# Patient Record
Sex: Male | Born: 2000 | Race: White | Hispanic: Yes | Marital: Single | Smoking: Never smoker
Health system: Southern US, Community
[De-identification: ages and names within clinical notes are randomized; demographics above are authoritative.]

## PROBLEM LIST (undated history)

## (undated) DIAGNOSIS — J45909 Unspecified asthma, uncomplicated: Secondary | ICD-10-CM

## (undated) DIAGNOSIS — F79 Unspecified intellectual disabilities: Secondary | ICD-10-CM

## (undated) DIAGNOSIS — F840 Autistic disorder: Secondary | ICD-10-CM

## (undated) DIAGNOSIS — K5900 Constipation, unspecified: Secondary | ICD-10-CM

## (undated) DIAGNOSIS — K56609 Unspecified intestinal obstruction, unspecified as to partial versus complete obstruction: Secondary | ICD-10-CM

## (undated) DIAGNOSIS — Q793 Gastroschisis: Secondary | ICD-10-CM

## (undated) HISTORY — DX: Constipation, unspecified: K59.00

## (undated) HISTORY — PX: GASTROSTOMY: SHX151

## (undated) HISTORY — DX: Unspecified intestinal obstruction, unspecified as to partial versus complete obstruction: K56.609

## (undated) HISTORY — PX: ORCHIOPEXY: SHX479

## (undated) HISTORY — PX: NISSEN FUNDOPLICATION: SHX2091

---
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
## 2018-01-06 ENCOUNTER — Other Ambulatory Visit: Payer: Self-pay | Admitting: Pediatrics

## 2018-01-06 DIAGNOSIS — J310 Chronic rhinitis: Secondary | ICD-10-CM

## 2018-01-07 IMAGING — CR DG ABDOMEN 2V
3 series · 3 of 3 positions shown · non-contrast
Comparison: May 29, 2016

CLINICAL DATA: Fever and diarrhea for 2 days.

EXAM:
ABDOMEN - 2 VIEW

[abdomen supine (1 of 2)]
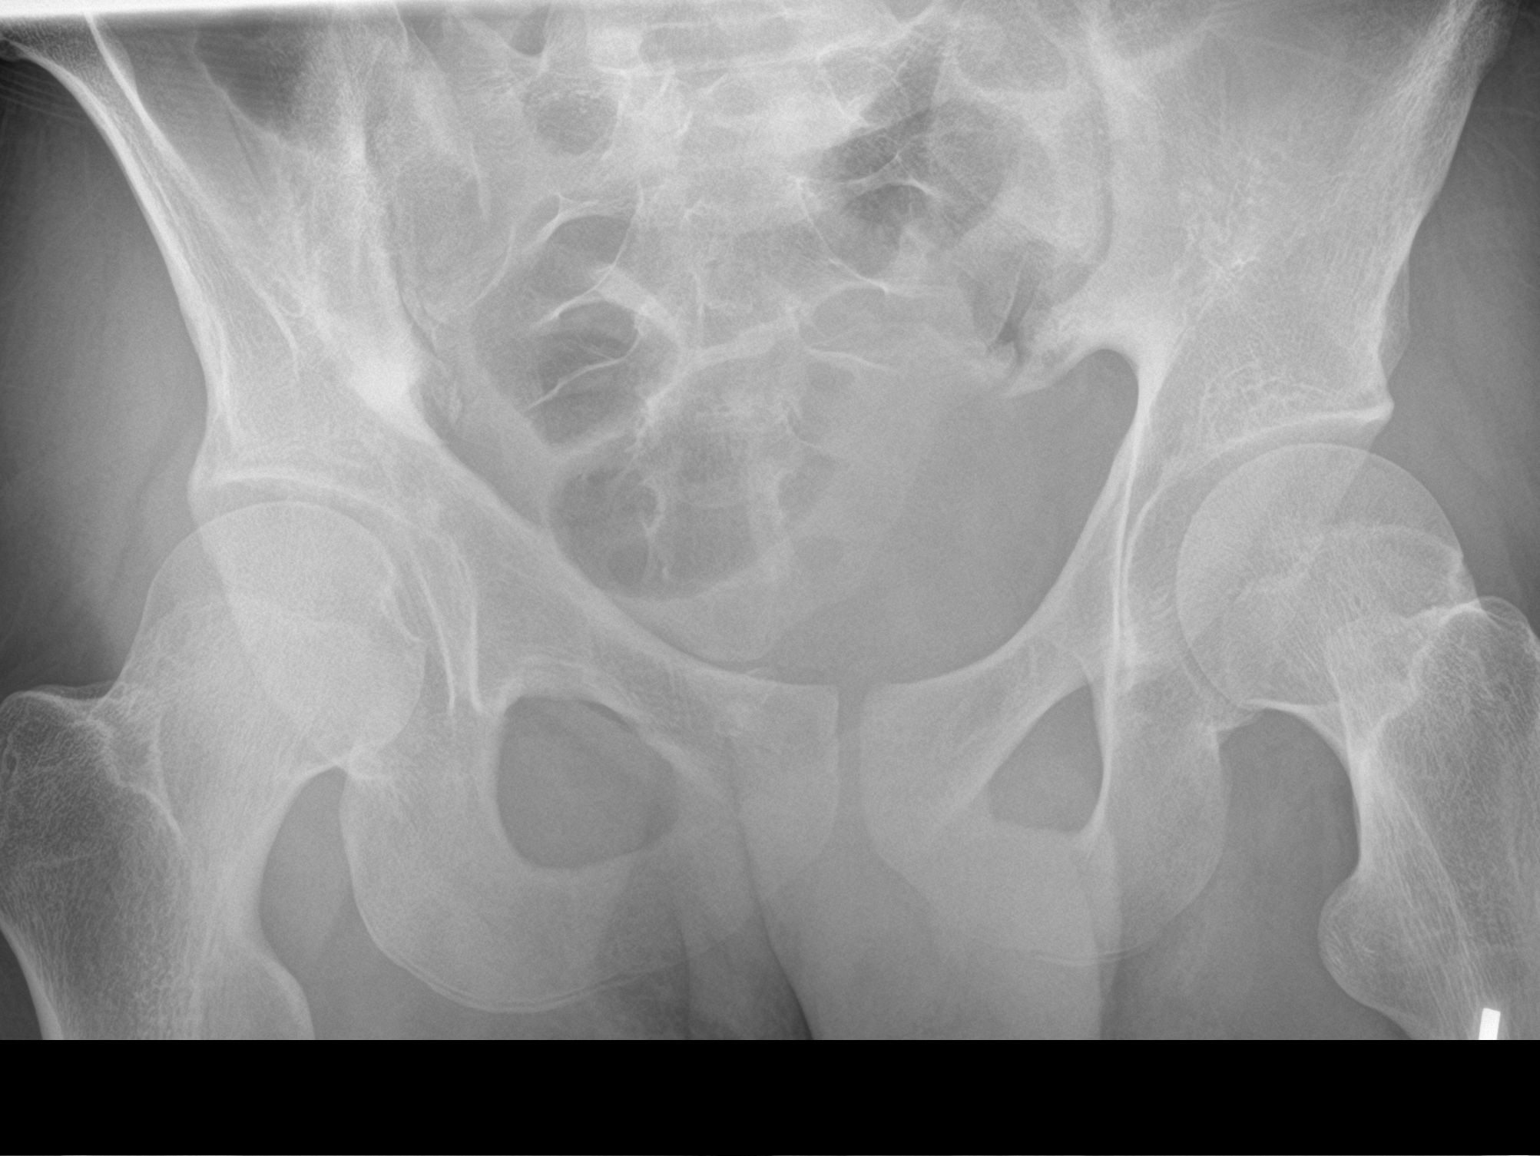

[abdomen supine (2 of 2)]
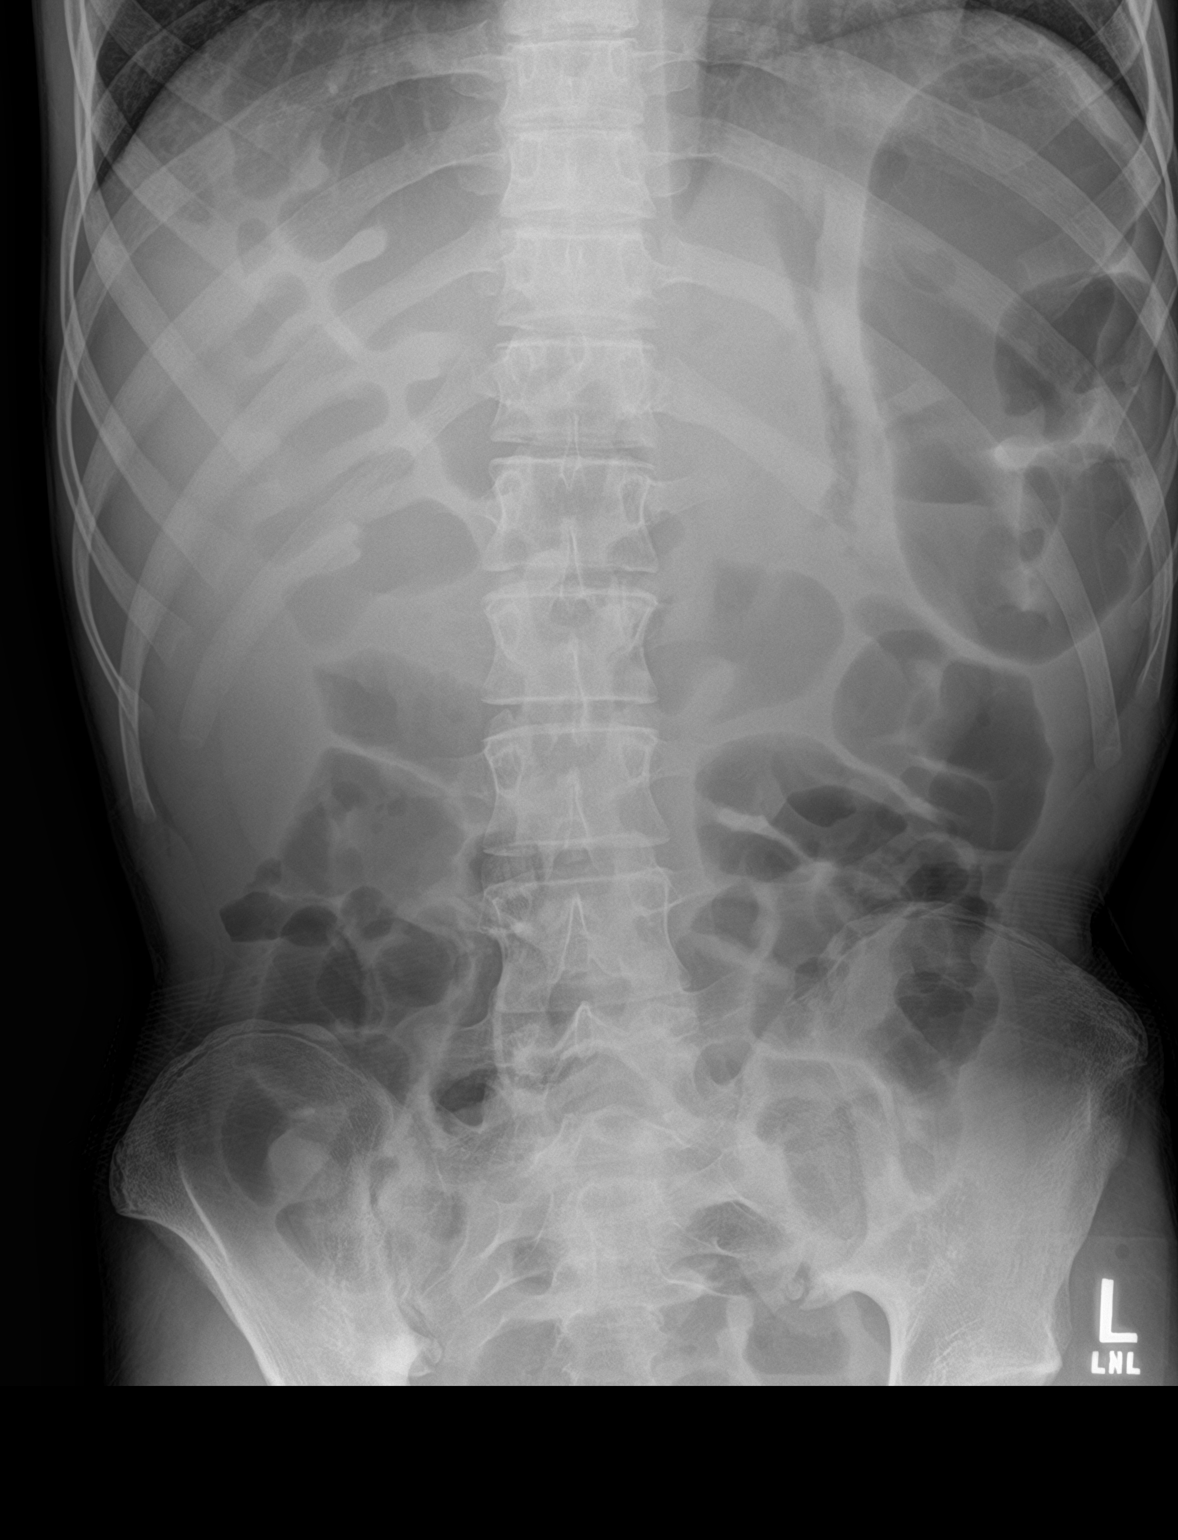

[abdomen erect]
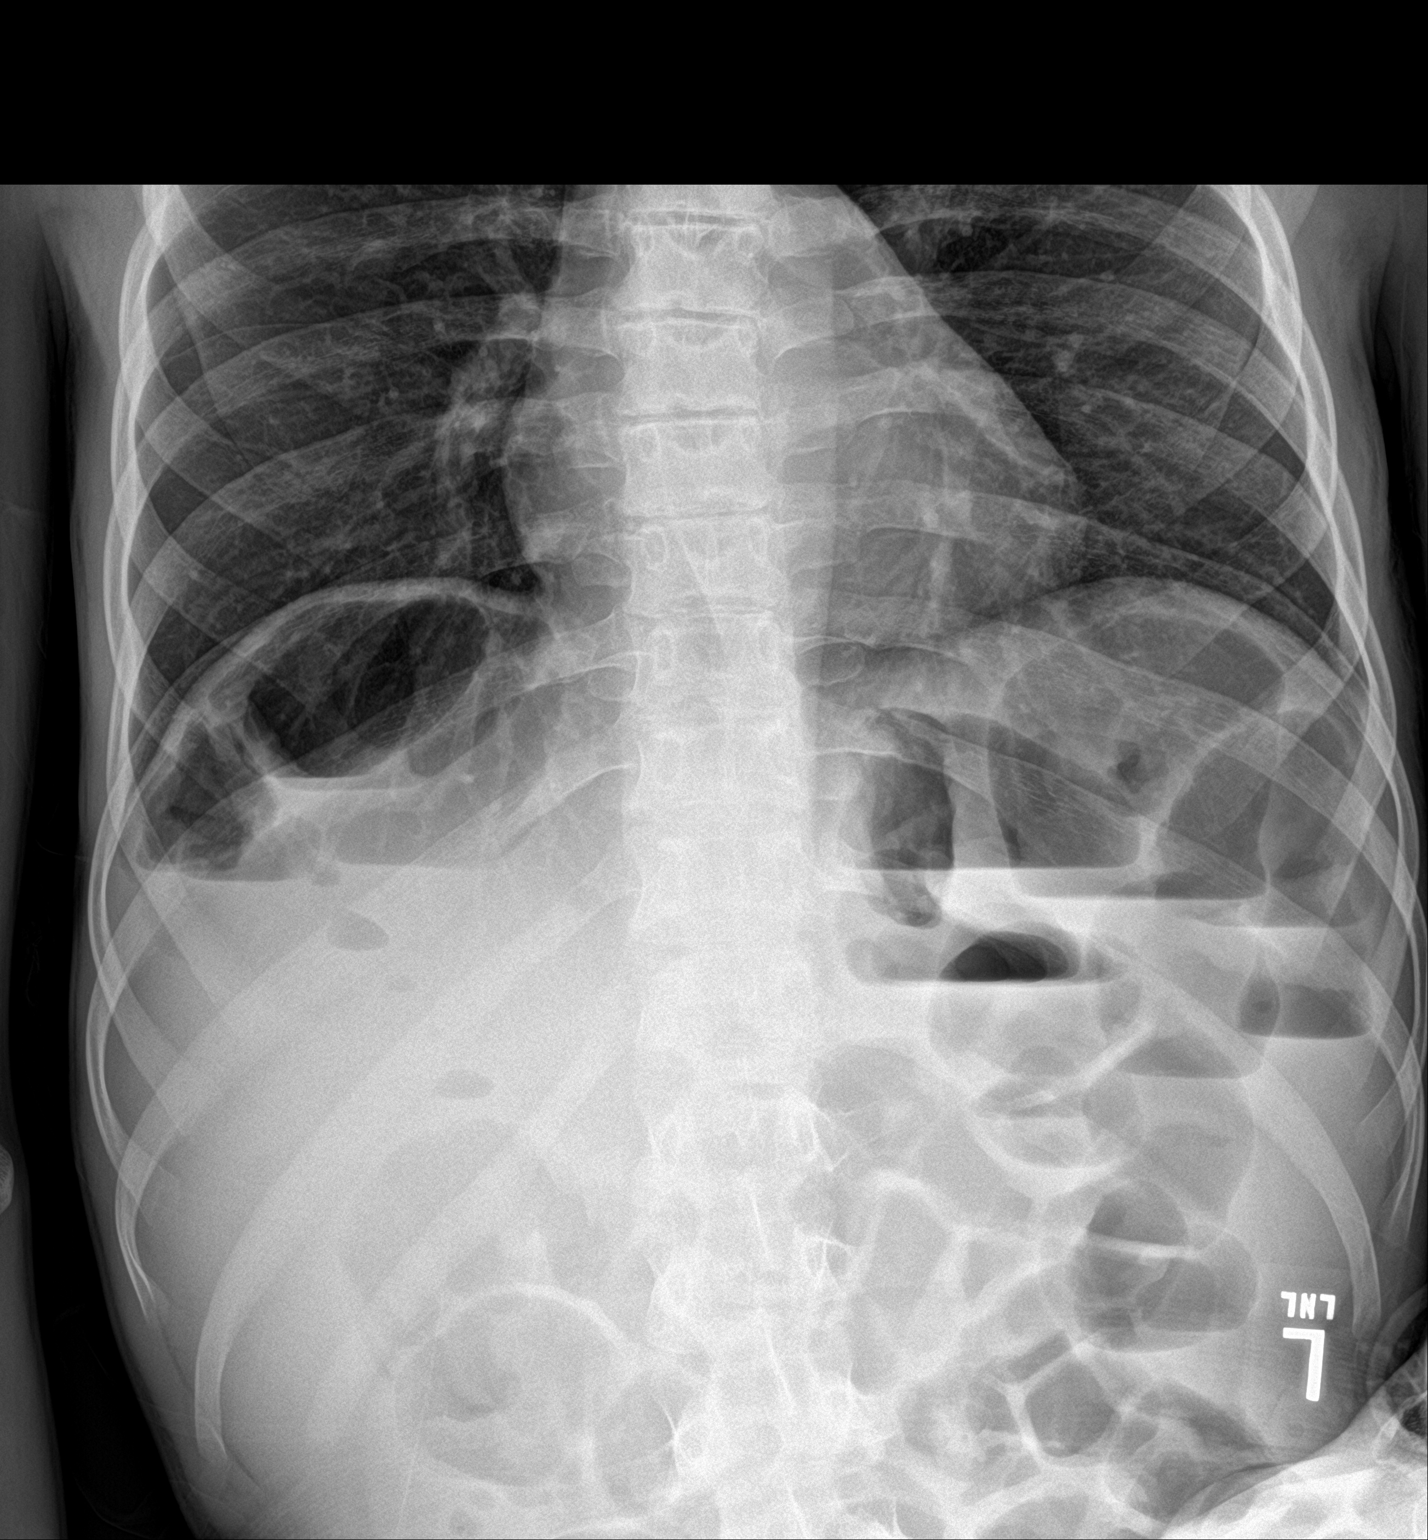

[3 of 3 positions shown; findings below may reference images not displayed]

FINDINGS: There is a mildly prominent loop of bowel in the left upper
quadrant, likely colon. The remainder of the bowel is normal in
caliber on supine imaging. Air-fluid levels are seen within loops of
colon on the upright view. There is colon interposed between the
liver and diaphragm. No free air.
IMPRESSION: 1. Air-fluid levels within the colon on upright imaging consistent
with diarrhea. No free air identified.
2. There is a mildly prominent loop of colon in the left upper
quadrant which is nonspecific. No small bowel obstruction.

## 2018-02-02 ENCOUNTER — Other Ambulatory Visit: Payer: Self-pay | Admitting: Pediatrics

## 2018-02-02 DIAGNOSIS — J310 Chronic rhinitis: Secondary | ICD-10-CM

## 2018-03-28 ENCOUNTER — Other Ambulatory Visit: Payer: Self-pay | Admitting: Pediatrics

## 2018-03-28 DIAGNOSIS — J310 Chronic rhinitis: Secondary | ICD-10-CM

## 2018-04-23 ENCOUNTER — Other Ambulatory Visit: Payer: Self-pay | Admitting: Pediatrics

## 2018-04-23 DIAGNOSIS — J310 Chronic rhinitis: Secondary | ICD-10-CM

## 2018-04-23 NOTE — Telephone Encounter (Signed)
Routed to correct pod, blue.  

## 2018-05-31 ENCOUNTER — Other Ambulatory Visit: Payer: Self-pay | Admitting: Pediatrics

## 2018-05-31 DIAGNOSIS — J310 Chronic rhinitis: Secondary | ICD-10-CM

## 2018-07-02 ENCOUNTER — Other Ambulatory Visit: Payer: Self-pay | Admitting: Pediatrics

## 2018-07-02 DIAGNOSIS — J310 Chronic rhinitis: Secondary | ICD-10-CM

## 2018-07-31 ENCOUNTER — Other Ambulatory Visit: Payer: Self-pay | Admitting: Pediatrics

## 2018-07-31 DIAGNOSIS — J310 Chronic rhinitis: Secondary | ICD-10-CM

## 2018-08-02 ENCOUNTER — Ambulatory Visit: Payer: Medicaid Other | Admitting: Pediatrics

## 2018-08-03 ENCOUNTER — Other Ambulatory Visit: Payer: Self-pay | Admitting: Pediatrics

## 2018-08-03 DIAGNOSIS — J4540 Moderate persistent asthma, uncomplicated: Secondary | ICD-10-CM

## 2018-08-23 ENCOUNTER — Other Ambulatory Visit: Payer: Self-pay | Admitting: Pediatrics

## 2018-08-23 DIAGNOSIS — J310 Chronic rhinitis: Secondary | ICD-10-CM

## 2018-10-05 ENCOUNTER — Other Ambulatory Visit: Payer: Self-pay | Admitting: Pediatrics

## 2018-10-05 DIAGNOSIS — J310 Chronic rhinitis: Secondary | ICD-10-CM

## 2018-10-05 NOTE — Telephone Encounter (Signed)
Will forward to correct pod, blue Rx.

## 2018-10-20 ENCOUNTER — Ambulatory Visit: Payer: Medicaid Other

## 2018-11-14 ENCOUNTER — Telehealth: Payer: Self-pay

## 2018-11-14 NOTE — Telephone Encounter (Signed)
Mother would like a call back with concerns about son returning to school. They are having students coming back to school this months and since pts is compromised, she would like to talk to you about her concerns.

## 2018-11-15 NOTE — Telephone Encounter (Signed)
I spoke with mom about her concerns.

## 2018-12-24 ENCOUNTER — Telehealth: Payer: Self-pay

## 2018-12-24 NOTE — Telephone Encounter (Signed)
Pre-screening for onsite visit  1. Who is bringing the patient to the visit? Dad only, mom said that the patient did start school today.  Informed only one adult can bring patient to the visit to limit possible exposure to COVID19 and facemasks must be worn while in the building by the patient (ages 17 and older) and adult.  2. Has the person bringing the patient or the patient been around anyone with suspected or confirmed COVID-19 in the last 14 days?    3. Has the person bringing the patient or the patient been around anyone who has been tested for COVID-19 in the last 14 days?   4. Has the person bringing the patient or the patient had any of these symptoms in the last 14 days?  Fever (temp 100 F or higher) Breathing problems Cough Sore throat Body aches Chills Vomiting Diarrhea   If all answers are negative, advise patient to call our office prior to your appointment if you or the patient develop any of the symptoms listed above.   If any answers are yes, cancel in-office visit and schedule the patient for a same day telehealth visit with a provider to discuss the next steps.

## 2018-12-25 ENCOUNTER — Other Ambulatory Visit (HOSPITAL_COMMUNITY)
Admission: RE | Admit: 2018-12-25 | Discharge: 2018-12-25 | Disposition: A | Discharge status: Home / Self Care | Payer: Medicaid Other | Source: Ambulatory Visit | Attending: Pediatrics | Admitting: Pediatrics

## 2018-12-25 ENCOUNTER — Encounter: Payer: Self-pay | Admitting: Pediatrics

## 2018-12-25 ENCOUNTER — Ambulatory Visit (INDEPENDENT_AMBULATORY_CARE_PROVIDER_SITE_OTHER): Payer: Medicaid Other | Admitting: Pediatrics

## 2018-12-25 ENCOUNTER — Other Ambulatory Visit: Payer: Self-pay

## 2018-12-25 ENCOUNTER — Encounter: Payer: Self-pay | Admitting: *Deleted

## 2018-12-25 VITALS — BP 111/73 | HR 93 | Ht 69.29 in | Wt 117.0 lb

## 2018-12-25 DIAGNOSIS — R350 Frequency of micturition: Secondary | ICD-10-CM

## 2018-12-25 DIAGNOSIS — R636 Underweight: Secondary | ICD-10-CM

## 2018-12-25 DIAGNOSIS — Z68.41 Body mass index (BMI) pediatric, less than 5th percentile for age: Secondary | ICD-10-CM | POA: Diagnosis not present

## 2018-12-25 DIAGNOSIS — Z113 Encounter for screening for infections with a predominantly sexual mode of transmission: Secondary | ICD-10-CM | POA: Diagnosis not present

## 2018-12-25 DIAGNOSIS — Z0001 Encounter for general adult medical examination with abnormal findings: Secondary | ICD-10-CM

## 2018-12-25 DIAGNOSIS — F919 Conduct disorder, unspecified: Secondary | ICD-10-CM

## 2018-12-25 DIAGNOSIS — F840 Autistic disorder: Secondary | ICD-10-CM

## 2018-12-25 LAB — POCT URINALYSIS DIPSTICK
Bilirubin, UA: NEGATIVE
Blood, UA: NEGATIVE
Glucose, UA: NEGATIVE
Leukocytes, UA: NEGATIVE
Nitrite, UA: NEGATIVE
Protein, UA: NEGATIVE
Spec Grav, UA: 1.01 (ref 1.010–1.025)
Urobilinogen, UA: NEGATIVE E.U./dL — AB
pH, UA: 8 (ref 5.0–8.0)

## 2018-12-25 LAB — POCT RAPID HIV: Rapid HIV, POC: NEGATIVE

## 2018-12-25 NOTE — Progress Notes (Signed)
Adolescent Well Care Visit [name] is a 18 y.o. male who is here for well care.    PCP:  Carmie End, MD   History was provided by the father. Patient is non-verbal.  Current Issues: Current concerns include knuckles are red and swollen - he is picking at his nails - problem for years but worse recently over the past few months.   He rubs his hands repeatedly.  He is peeing more frequently than usual during the day for the past several months while he has been home.  This isn't every day but some days.  He goes to the bathroom quickly after eating a meal.   No concerns about constipation or diarrhea.  Asthma - Previously prescribed pulmicort 0.5 mg BID and albuterol nebs prn.  Pulmicort recently restarted giving twice daily about 2 weeks ago in preparation for return to school.  Last used Albuterol several months ago.   Allergies are better with cetirizine.  Taking daily in the morning.   Autism - previously prescribed divalproex and risperidone.  Previously seen by Dr. Quentin Cornwall and then transferred care to [facility] psychiatry at age 19.  He now sees an NP there every 3 months - father is unhappy with the care there - they have not made changes to his medication regimen - in spite of parents reporting worsening behavior over the past few months.  He  just started school yesterday in person 2 days per week - previously all online and he was not able to pay attention and engage with the online classes.    Underweight - still taking Ensure twice daily.  Picky eater and eats slowly.  No constipation.  One episode of diarrhea yesterday.   Nutrition: Nutrition/Eating Behaviors: fruits in smoothies, doesn't like any meat, them try to hide it but he picks it out, Ensure twice daily Adequate calcium in diet?: yes Supplements/ Vitamins: MVI, VItamin C  Sleep:  Sleep: sometimes difficulty falling asleep - has Rx from psychiatry that doesn't seem to help any more.  Social  Screening: Lives with:  Parents and younger brother Stressors of note: yes - COVID pandemic  Education: School Name: [employer]  School Grade: EC classroom until age 59. School performance: he did not do well with online classes, father is hopeful that in-person school will go better for him.  He is in a small self-contained classroom with about 5 other students. School Behavior: more repetitive self-stim behaviors recently.   Patient has a dental home: yes  Physical Exam:  Vitals:   12/25/18 0946  BP: 111/73  Pulse: 93  Weight: 117 lb (53.1 kg)  Height: 5' 9.29" (1.76 m)   BP 111/73 (BP Location: Left Arm, Patient Position: Sitting, Cuff Size: Normal)   Pulse 93   Ht 5' 9.29" (1.76 m)   Wt 117 lb (53.1 kg)   BMI 17.13 kg/m  Body mass index: body mass index is 17.13 kg/m.    Hearing Screening   Method: Otoacoustic emissions   125Hz  250Hz  500Hz  1000Hz  2000Hz  3000Hz  4000Hz  6000Hz  8000Hz   Right ear:           Left ear:           Comments: OAE-left ear pass,right ear pass   General Appearance:   awake, poor eye contact, cooperative with exam, nonverbal  HENT: Dolichocephaly present, conjunctiva clear, PERRL  Mouth:   Normal appearing teeth, no obvious discoloration or dental caries  Neck:   Supple; thyroid: no enlargement,  symmetric, no tenderness/mass/nodules  Chest Normal male  Lungs:   Clear to auscultation bilaterally, normal work of breathing  Heart:   Regular rate and rhythm, S1 and S2 normal, no murmurs;   Abdomen:   Soft, non-tender, no mass, or organomegaly  GU normal male genitals, no testicular masses or hernia, Tanner stage V  Musculoskeletal:   Tone and strength strong and symmetrical, all extremities               Lymphatic:   No cervical adenopathy  Skin/Hair/Nails:   Skin warm, dry and intact, no rashes, no bruises or petechiae  Neurologic:   Strength, gait, and coordination normal and age-appropriate   Assessment and Plan:   Routine  screening for STI (sexually transmitted infection) At risk age group - POC Rapid HIV - GC/Chlamydia [facility] Lab - for urine and other sample types  Urinary frequency Dilute specific gravity.  No blood, protein, or signs of infection.  Small ketones - patient fasting this AM.  If urinary frequency presists, will plan to restrict water intake and repeat U/A to ensure appropriate concentration of urine.   - POC Urinalysis (dx code Z13.89)  Autism spectrum disorder with accompanying language impairment and intellectual disability, requiring substantial support Father requests referral to new provider for medications management given worsening behaviors on current medications.   - Referral to Pediatric Psychiatry  Underweight - BMI is not appropriate for age - he remains underweight but has gained weight and his BMI has increased slightly to 17.1.  Continue Ensure BID and reviewed high-calorie foods.    Hearing screening result:normal OAE Vision screening result: unable to complete vision screening - sees eye doctor annually for his glasses   Return for 18 year old PE with Dr. Luna Fuse in 1 year.Clifton Custard, MD

## 2018-12-26 LAB — URINE CYTOLOGY ANCILLARY ONLY
Chlamydia: NEGATIVE
Comment: NEGATIVE
Comment: NORMAL
Neisseria Gonorrhea: NEGATIVE

## 2019-01-17 ENCOUNTER — Ambulatory Visit (INDEPENDENT_AMBULATORY_CARE_PROVIDER_SITE_OTHER): Payer: Medicaid Other | Admitting: Pediatrics

## 2019-01-17 ENCOUNTER — Other Ambulatory Visit: Payer: Self-pay

## 2019-01-17 ENCOUNTER — Encounter: Payer: Self-pay | Admitting: Pediatrics

## 2019-01-17 VITALS — Temp 98.2°F

## 2019-01-17 DIAGNOSIS — Z711 Person with feared health complaint in whom no diagnosis is made: Secondary | ICD-10-CM

## 2019-01-17 NOTE — Progress Notes (Signed)
Virtual Visit via Video Note  I connected with [name] 's mother  on 01/17/19 at  5:00 PM EST by a video enabled telemedicine application and verified that I am speaking with the correct person using two identifiers.   Location of patient/parent: patients home   I discussed the limitations of evaluation and management by telemedicine and the availability of in person appointments.  I discussed that the purpose of this telehealth visit is to provide medical care while limiting exposure to the novel coronavirus.  The mother expressed understanding and agreed to proceed.  Reason for visit:  Follow-up for "fever"  History of Present Illness: 17yo with autism and asthma/allergies who calls with mom for a "fever". Mom states [name] was bundled and sent to school when they did a general "temperature check" via forehead probe and was 102. Repeated it right away and was 104. Dad immediately picked him up and rechecked which was 98. Mom has been taking the temperature every two hours since 2 days ago and have had no fever. He has no symptoms--no cough, rhinorrhea, urine changes, pain (including myalgias). She has monitored him now for nearly 48 hours with no symptoms. No one in the house with symptoms. No COVID exposures.   Observations/Objective: appears well, repeating what mom states. Currently afebrile  Assessment and Plan: 18yo M with autism and asthma with an apparent fever at school. It seems to me this may be an error given that no antipyretics were given and now the temperature has been normal for nearly 48 hours. Provided mother with emailed note to return to school unless fever were to recurr.  Follow Up Instructions: see above   I discussed the assessment and treatment plan with the patient and/or parent/guardian. They were provided an opportunity to ask questions and all were answered. They agreed with the plan and demonstrated an understanding of the instructions.   They were  advised to call back or seek an in-person evaluation in the emergency room if the symptoms worsen or if the condition fails to improve as anticipated.  I spent 10 minutes on this telehealth visit inclusive of face-to-face video and care coordination time I was located at [facility] during this encounter.  Alma Friendly, MD

## 2019-04-03 ENCOUNTER — Telehealth: Payer: Self-pay | Admitting: Licensed Clinical Social Worker

## 2019-04-03 NOTE — Telephone Encounter (Signed)
Called w/ assistance of [employer]. LVM for mom requesting call back.

## 2019-07-11 ENCOUNTER — Telehealth: Payer: Self-pay | Admitting: Pediatrics

## 2019-07-11 NOTE — Telephone Encounter (Signed)
"  Disability parking placard" placed in PCP's folder to be completed and sign. 

## 2019-07-11 NOTE — Telephone Encounter (Signed)
Please call as soon form is ready to pick up [phone]

## 2019-07-15 NOTE — Telephone Encounter (Signed)
Form remains in Dr. Ettefagh's folder. 

## 2019-07-16 NOTE — Telephone Encounter (Signed)
Form done. Original placed at front desk for pick up. Copy made for med record to be scan  

## 2019-10-01 ENCOUNTER — Other Ambulatory Visit: Payer: Self-pay | Admitting: Pediatrics

## 2019-10-01 DIAGNOSIS — J4540 Moderate persistent asthma, uncomplicated: Secondary | ICD-10-CM

## 2019-10-18 ENCOUNTER — Other Ambulatory Visit: Payer: Self-pay | Admitting: Critical Care Medicine

## 2019-10-18 ENCOUNTER — Other Ambulatory Visit: Payer: Self-pay

## 2019-10-18 DIAGNOSIS — Z20822 Contact with and (suspected) exposure to covid-19: Secondary | ICD-10-CM

## 2019-10-21 LAB — NOVEL CORONAVIRUS, NAA: SARS-CoV-2, NAA: NOT DETECTED

## 2019-10-26 ENCOUNTER — Other Ambulatory Visit: Payer: Self-pay | Admitting: Pediatrics

## 2019-10-26 DIAGNOSIS — J310 Chronic rhinitis: Secondary | ICD-10-CM

## 2019-10-30 ENCOUNTER — Other Ambulatory Visit: Payer: Medicaid Other

## 2019-10-30 DIAGNOSIS — Z20822 Contact with and (suspected) exposure to covid-19: Secondary | ICD-10-CM

## 2019-11-01 LAB — SARS-COV-2, NAA 2 DAY TAT

## 2019-11-01 LAB — NOVEL CORONAVIRUS, NAA: SARS-CoV-2, NAA: NOT DETECTED

## 2020-03-16 ENCOUNTER — Telehealth: Payer: Self-pay

## 2020-03-16 ENCOUNTER — Other Ambulatory Visit: Payer: Self-pay | Admitting: Pediatrics

## 2020-03-16 DIAGNOSIS — J4540 Moderate persistent asthma, uncomplicated: Secondary | ICD-10-CM

## 2020-03-16 MED ORDER — FLOVENT HFA 110 MCG/ACT IN AERO: 2.0000 | INHALATION_SPRAY | Freq: Two times a day (BID) | RESPIRATORY_TRACT | 12 refills | Status: DC

## 2020-03-16 MED ORDER — BUDESONIDE 0.5 MG/2ML IN SUSP: 0.5000 mg | Freq: Two times a day (BID) | RESPIRATORY_TRACT | 12 refills | Status: AC

## 2020-03-16 NOTE — Telephone Encounter (Signed)
Spoke with [name]'s mother, [name], who says that [name] cannot use an inhaler,he needs a Nebulizer.She also needs a list of Providers to arrange for his future care.

## 2020-03-16 NOTE — Progress Notes (Signed)
Mother reports that pulmicort is no longer covered by his insurance.  New Rx for flovent 110 mcg inhaler was sent to the pharmacy on file.

## 2020-03-16 NOTE — Telephone Encounter (Signed)
Please call his mother and let her know that I will send a prescription for Flovent to the pharmacy which is an inhaler that is equivalent to the pulmicort nebulizers.  Please also let her know that Menelik will need to transition to an adult medical provider since our office is not contracted with Medicare.  Let mom know that I can provide a list of adult medical providers for Jatin if that would be helpful.

## 2020-03-16 NOTE — Telephone Encounter (Signed)
I called and spoke with the pharmacy staff.  They were able to get the Rx covered by medicare with a patient co-pay.    I called and spoke with [place]'s mother about options for transition to adult medical care.  Recommend community [facility] center as an option for him.

## 2020-03-16 NOTE — Telephone Encounter (Signed)
Mom says that [employer] was changed to Medicare when he turned 19; they do not cover pulmicort solution for nebulizer.

## 2020-04-10 ENCOUNTER — Encounter: Payer: Self-pay | Admitting: Pediatrics

## 2020-05-21 ENCOUNTER — Encounter: Payer: Self-pay | Admitting: Developmental - Behavioral Pediatrics

## 2020-10-08 ENCOUNTER — Telehealth: Payer: Self-pay

## 2020-10-08 DIAGNOSIS — Z09 Encounter for follow-up examination after completed treatment for conditions other than malignant neoplasm: Secondary | ICD-10-CM

## 2020-10-08 NOTE — Telephone Encounter (Signed)
[employer] mailed adolescent transition dismissal letter to pt, marked dismissed  from practice in pt's chart due to age. Can be seen by [facility] until age 20 if needed.   Kodey Xue, BSW, QP Case Manager [facility] Office: [phone] Direct Number: [phone]  

## 2020-11-04 ENCOUNTER — Telehealth: Payer: Self-pay

## 2020-11-04 DIAGNOSIS — Z09 Encounter for follow-up examination after completed treatment for conditions other than malignant neoplasm: Secondary | ICD-10-CM

## 2020-11-04 NOTE — Telephone Encounter (Signed)
SWCM called mother regarding Adolescent Transition for pt. Mother stated that pt has been seen by adult PCP, and has a follow up in 6 months.    Kenn File, BSW, QP Case Manager [facility] Office: [phone] Direct Number: [phone]

## 2020-11-17 DIAGNOSIS — R269 Unspecified abnormalities of gait and mobility: Secondary | ICD-10-CM | POA: Diagnosis not present

## 2020-11-17 DIAGNOSIS — F79 Unspecified intellectual disabilities: Secondary | ICD-10-CM | POA: Diagnosis not present

## 2020-11-17 DIAGNOSIS — R636 Underweight: Secondary | ICD-10-CM | POA: Diagnosis not present

## 2020-12-16 DIAGNOSIS — F88 Other disorders of psychological development: Secondary | ICD-10-CM | POA: Diagnosis not present

## 2020-12-16 DIAGNOSIS — F79 Unspecified intellectual disabilities: Secondary | ICD-10-CM | POA: Diagnosis not present

## 2020-12-16 DIAGNOSIS — R636 Underweight: Secondary | ICD-10-CM | POA: Diagnosis not present

## 2020-12-16 DIAGNOSIS — F3481 Disruptive mood dysregulation disorder: Secondary | ICD-10-CM | POA: Diagnosis not present

## 2020-12-16 DIAGNOSIS — R269 Unspecified abnormalities of gait and mobility: Secondary | ICD-10-CM | POA: Diagnosis not present

## 2020-12-21 DIAGNOSIS — R636 Underweight: Secondary | ICD-10-CM | POA: Diagnosis not present

## 2020-12-21 DIAGNOSIS — F79 Unspecified intellectual disabilities: Secondary | ICD-10-CM | POA: Diagnosis not present

## 2020-12-21 DIAGNOSIS — R269 Unspecified abnormalities of gait and mobility: Secondary | ICD-10-CM | POA: Diagnosis not present

## 2021-01-14 DIAGNOSIS — R269 Unspecified abnormalities of gait and mobility: Secondary | ICD-10-CM | POA: Diagnosis not present

## 2021-01-14 DIAGNOSIS — F79 Unspecified intellectual disabilities: Secondary | ICD-10-CM | POA: Diagnosis not present

## 2021-01-14 DIAGNOSIS — R636 Underweight: Secondary | ICD-10-CM | POA: Diagnosis not present

## 2021-02-09 DIAGNOSIS — E786 Lipoprotein deficiency: Secondary | ICD-10-CM | POA: Diagnosis not present

## 2021-02-09 DIAGNOSIS — F840 Autistic disorder: Secondary | ICD-10-CM | POA: Diagnosis not present

## 2021-02-09 DIAGNOSIS — F39 Unspecified mood [affective] disorder: Secondary | ICD-10-CM | POA: Diagnosis not present

## 2021-02-09 DIAGNOSIS — J4530 Mild persistent asthma, uncomplicated: Secondary | ICD-10-CM | POA: Diagnosis not present

## 2021-02-09 DIAGNOSIS — Z20822 Contact with and (suspected) exposure to covid-19: Secondary | ICD-10-CM | POA: Diagnosis not present

## 2021-02-09 DIAGNOSIS — Z681 Body mass index (BMI) 19 or less, adult: Secondary | ICD-10-CM | POA: Diagnosis not present

## 2021-02-09 DIAGNOSIS — Z79899 Other long term (current) drug therapy: Secondary | ICD-10-CM | POA: Diagnosis not present

## 2021-02-10 DIAGNOSIS — F88 Other disorders of psychological development: Secondary | ICD-10-CM | POA: Diagnosis not present

## 2021-02-10 DIAGNOSIS — F840 Autistic disorder: Secondary | ICD-10-CM | POA: Diagnosis not present

## 2021-02-10 DIAGNOSIS — F3481 Disruptive mood dysregulation disorder: Secondary | ICD-10-CM | POA: Diagnosis not present

## 2021-02-15 DIAGNOSIS — R269 Unspecified abnormalities of gait and mobility: Secondary | ICD-10-CM | POA: Diagnosis not present

## 2021-02-15 DIAGNOSIS — F79 Unspecified intellectual disabilities: Secondary | ICD-10-CM | POA: Diagnosis not present

## 2021-02-15 DIAGNOSIS — R636 Underweight: Secondary | ICD-10-CM | POA: Diagnosis not present

## 2021-02-24 DIAGNOSIS — B351 Tinea unguium: Secondary | ICD-10-CM | POA: Diagnosis not present

## 2021-04-08 DIAGNOSIS — F79 Unspecified intellectual disabilities: Secondary | ICD-10-CM | POA: Diagnosis not present

## 2021-04-08 DIAGNOSIS — R636 Underweight: Secondary | ICD-10-CM | POA: Diagnosis not present

## 2021-04-08 DIAGNOSIS — R269 Unspecified abnormalities of gait and mobility: Secondary | ICD-10-CM | POA: Diagnosis not present

## 2021-05-05 DIAGNOSIS — F840 Autistic disorder: Secondary | ICD-10-CM | POA: Diagnosis not present

## 2021-05-05 DIAGNOSIS — F88 Other disorders of psychological development: Secondary | ICD-10-CM | POA: Diagnosis not present

## 2021-05-05 DIAGNOSIS — F3481 Disruptive mood dysregulation disorder: Secondary | ICD-10-CM | POA: Diagnosis not present

## 2021-05-18 DIAGNOSIS — F79 Unspecified intellectual disabilities: Secondary | ICD-10-CM | POA: Diagnosis not present

## 2021-05-18 DIAGNOSIS — R636 Underweight: Secondary | ICD-10-CM | POA: Diagnosis not present

## 2021-05-18 DIAGNOSIS — R269 Unspecified abnormalities of gait and mobility: Secondary | ICD-10-CM | POA: Diagnosis not present

## 2021-06-14 DIAGNOSIS — F79 Unspecified intellectual disabilities: Secondary | ICD-10-CM | POA: Diagnosis not present

## 2021-06-14 DIAGNOSIS — R269 Unspecified abnormalities of gait and mobility: Secondary | ICD-10-CM | POA: Diagnosis not present

## 2021-06-14 DIAGNOSIS — R636 Underweight: Secondary | ICD-10-CM | POA: Diagnosis not present

## 2021-07-15 DIAGNOSIS — R269 Unspecified abnormalities of gait and mobility: Secondary | ICD-10-CM | POA: Diagnosis not present

## 2021-07-15 DIAGNOSIS — R636 Underweight: Secondary | ICD-10-CM | POA: Diagnosis not present

## 2021-07-15 DIAGNOSIS — F79 Unspecified intellectual disabilities: Secondary | ICD-10-CM | POA: Diagnosis not present

## 2021-08-03 DIAGNOSIS — H5203 Hypermetropia, bilateral: Secondary | ICD-10-CM | POA: Diagnosis not present

## 2021-08-03 DIAGNOSIS — Z0100 Encounter for examination of eyes and vision without abnormal findings: Secondary | ICD-10-CM | POA: Diagnosis not present

## 2021-08-11 DIAGNOSIS — Z131 Encounter for screening for diabetes mellitus: Secondary | ICD-10-CM | POA: Diagnosis not present

## 2021-08-11 DIAGNOSIS — Z79899 Other long term (current) drug therapy: Secondary | ICD-10-CM | POA: Diagnosis not present

## 2021-08-11 DIAGNOSIS — E786 Lipoprotein deficiency: Secondary | ICD-10-CM | POA: Diagnosis not present

## 2021-08-11 DIAGNOSIS — Z114 Encounter for screening for human immunodeficiency virus [HIV]: Secondary | ICD-10-CM | POA: Diagnosis not present

## 2021-08-16 DIAGNOSIS — Z008 Encounter for other general examination: Secondary | ICD-10-CM | POA: Diagnosis not present

## 2021-08-23 DIAGNOSIS — R636 Underweight: Secondary | ICD-10-CM | POA: Diagnosis not present

## 2021-08-23 DIAGNOSIS — F79 Unspecified intellectual disabilities: Secondary | ICD-10-CM | POA: Diagnosis not present

## 2021-08-23 DIAGNOSIS — R269 Unspecified abnormalities of gait and mobility: Secondary | ICD-10-CM | POA: Diagnosis not present

## 2021-08-26 DIAGNOSIS — F3481 Disruptive mood dysregulation disorder: Secondary | ICD-10-CM | POA: Diagnosis not present

## 2021-09-20 DIAGNOSIS — R636 Underweight: Secondary | ICD-10-CM | POA: Diagnosis not present

## 2021-09-20 DIAGNOSIS — F79 Unspecified intellectual disabilities: Secondary | ICD-10-CM | POA: Diagnosis not present

## 2021-09-20 DIAGNOSIS — R269 Unspecified abnormalities of gait and mobility: Secondary | ICD-10-CM | POA: Diagnosis not present

## 2021-10-18 DIAGNOSIS — F79 Unspecified intellectual disabilities: Secondary | ICD-10-CM | POA: Diagnosis not present

## 2021-10-18 DIAGNOSIS — R636 Underweight: Secondary | ICD-10-CM | POA: Diagnosis not present

## 2021-10-18 DIAGNOSIS — R269 Unspecified abnormalities of gait and mobility: Secondary | ICD-10-CM | POA: Diagnosis not present

## 2021-11-17 DIAGNOSIS — F3481 Disruptive mood dysregulation disorder: Secondary | ICD-10-CM | POA: Diagnosis not present

## 2021-11-18 DIAGNOSIS — R636 Underweight: Secondary | ICD-10-CM | POA: Diagnosis not present

## 2021-11-18 DIAGNOSIS — R269 Unspecified abnormalities of gait and mobility: Secondary | ICD-10-CM | POA: Diagnosis not present

## 2021-11-18 DIAGNOSIS — F79 Unspecified intellectual disabilities: Secondary | ICD-10-CM | POA: Diagnosis not present

## 2021-12-08 DIAGNOSIS — F3481 Disruptive mood dysregulation disorder: Secondary | ICD-10-CM | POA: Diagnosis not present

## 2021-12-15 DIAGNOSIS — E786 Lipoprotein deficiency: Secondary | ICD-10-CM | POA: Diagnosis not present

## 2021-12-15 DIAGNOSIS — J4530 Mild persistent asthma, uncomplicated: Secondary | ICD-10-CM | POA: Diagnosis not present

## 2021-12-15 DIAGNOSIS — Z79899 Other long term (current) drug therapy: Secondary | ICD-10-CM | POA: Diagnosis not present

## 2021-12-15 DIAGNOSIS — G2401 Drug induced subacute dyskinesia: Secondary | ICD-10-CM | POA: Diagnosis not present

## 2021-12-15 DIAGNOSIS — F39 Unspecified mood [affective] disorder: Secondary | ICD-10-CM | POA: Diagnosis not present

## 2021-12-15 DIAGNOSIS — R269 Unspecified abnormalities of gait and mobility: Secondary | ICD-10-CM | POA: Diagnosis not present

## 2021-12-15 DIAGNOSIS — F840 Autistic disorder: Secondary | ICD-10-CM | POA: Diagnosis not present

## 2021-12-15 DIAGNOSIS — Z681 Body mass index (BMI) 19 or less, adult: Secondary | ICD-10-CM | POA: Diagnosis not present

## 2021-12-16 DIAGNOSIS — F3481 Disruptive mood dysregulation disorder: Secondary | ICD-10-CM | POA: Diagnosis not present

## 2021-12-17 DIAGNOSIS — R269 Unspecified abnormalities of gait and mobility: Secondary | ICD-10-CM | POA: Diagnosis not present

## 2021-12-17 DIAGNOSIS — R636 Underweight: Secondary | ICD-10-CM | POA: Diagnosis not present

## 2021-12-17 DIAGNOSIS — F79 Unspecified intellectual disabilities: Secondary | ICD-10-CM | POA: Diagnosis not present

## 2021-12-20 DIAGNOSIS — E786 Lipoprotein deficiency: Secondary | ICD-10-CM | POA: Diagnosis not present

## 2021-12-20 DIAGNOSIS — Z79899 Other long term (current) drug therapy: Secondary | ICD-10-CM | POA: Diagnosis not present

## 2022-01-13 DIAGNOSIS — R636 Underweight: Secondary | ICD-10-CM | POA: Diagnosis not present

## 2022-01-13 DIAGNOSIS — R269 Unspecified abnormalities of gait and mobility: Secondary | ICD-10-CM | POA: Diagnosis not present

## 2022-01-13 DIAGNOSIS — F79 Unspecified intellectual disabilities: Secondary | ICD-10-CM | POA: Diagnosis not present

## 2022-02-07 DIAGNOSIS — R269 Unspecified abnormalities of gait and mobility: Secondary | ICD-10-CM | POA: Diagnosis not present

## 2022-02-07 DIAGNOSIS — R636 Underweight: Secondary | ICD-10-CM | POA: Diagnosis not present

## 2022-02-07 DIAGNOSIS — F79 Unspecified intellectual disabilities: Secondary | ICD-10-CM | POA: Diagnosis not present

## 2022-02-13 DIAGNOSIS — R269 Unspecified abnormalities of gait and mobility: Secondary | ICD-10-CM | POA: Diagnosis not present

## 2022-02-13 DIAGNOSIS — R636 Underweight: Secondary | ICD-10-CM | POA: Diagnosis not present

## 2022-02-13 DIAGNOSIS — F79 Unspecified intellectual disabilities: Secondary | ICD-10-CM | POA: Diagnosis not present

## 2022-02-16 DIAGNOSIS — F840 Autistic disorder: Secondary | ICD-10-CM | POA: Diagnosis not present

## 2022-03-14 DIAGNOSIS — F79 Unspecified intellectual disabilities: Secondary | ICD-10-CM | POA: Diagnosis not present

## 2022-03-14 DIAGNOSIS — R636 Underweight: Secondary | ICD-10-CM | POA: Diagnosis not present

## 2022-03-14 DIAGNOSIS — R269 Unspecified abnormalities of gait and mobility: Secondary | ICD-10-CM | POA: Diagnosis not present

## 2022-03-22 DIAGNOSIS — E786 Lipoprotein deficiency: Secondary | ICD-10-CM | POA: Diagnosis not present

## 2022-03-22 DIAGNOSIS — Z79899 Other long term (current) drug therapy: Secondary | ICD-10-CM | POA: Diagnosis not present

## 2022-03-28 DIAGNOSIS — G2401 Drug induced subacute dyskinesia: Secondary | ICD-10-CM | POA: Diagnosis not present

## 2022-04-05 DIAGNOSIS — I739 Peripheral vascular disease, unspecified: Secondary | ICD-10-CM | POA: Diagnosis not present

## 2022-04-12 DIAGNOSIS — R636 Underweight: Secondary | ICD-10-CM | POA: Diagnosis not present

## 2022-04-12 DIAGNOSIS — R269 Unspecified abnormalities of gait and mobility: Secondary | ICD-10-CM | POA: Diagnosis not present

## 2022-04-12 DIAGNOSIS — F79 Unspecified intellectual disabilities: Secondary | ICD-10-CM | POA: Diagnosis not present

## 2022-04-15 DIAGNOSIS — R251 Tremor, unspecified: Secondary | ICD-10-CM | POA: Diagnosis not present

## 2022-04-15 DIAGNOSIS — F419 Anxiety disorder, unspecified: Secondary | ICD-10-CM | POA: Diagnosis not present

## 2022-04-15 DIAGNOSIS — F840 Autistic disorder: Secondary | ICD-10-CM | POA: Diagnosis not present

## 2022-04-15 DIAGNOSIS — Z7951 Long term (current) use of inhaled steroids: Secondary | ICD-10-CM | POA: Diagnosis not present

## 2022-04-15 DIAGNOSIS — R32 Unspecified urinary incontinence: Secondary | ICD-10-CM | POA: Diagnosis not present

## 2022-04-15 DIAGNOSIS — Z681 Body mass index (BMI) 19 or less, adult: Secondary | ICD-10-CM | POA: Diagnosis not present

## 2022-04-15 DIAGNOSIS — G4700 Insomnia, unspecified: Secondary | ICD-10-CM | POA: Diagnosis not present

## 2022-04-15 DIAGNOSIS — R636 Underweight: Secondary | ICD-10-CM | POA: Diagnosis not present

## 2022-04-15 DIAGNOSIS — K5900 Constipation, unspecified: Secondary | ICD-10-CM | POA: Diagnosis not present

## 2022-04-15 DIAGNOSIS — J45909 Unspecified asthma, uncomplicated: Secondary | ICD-10-CM | POA: Diagnosis not present

## 2022-04-15 DIAGNOSIS — F349 Persistent mood [affective] disorder, unspecified: Secondary | ICD-10-CM | POA: Diagnosis not present

## 2022-05-11 DIAGNOSIS — F840 Autistic disorder: Secondary | ICD-10-CM | POA: Diagnosis not present

## 2022-05-16 DIAGNOSIS — F79 Unspecified intellectual disabilities: Secondary | ICD-10-CM | POA: Diagnosis not present

## 2022-05-16 DIAGNOSIS — R636 Underweight: Secondary | ICD-10-CM | POA: Diagnosis not present

## 2022-05-16 DIAGNOSIS — R269 Unspecified abnormalities of gait and mobility: Secondary | ICD-10-CM | POA: Diagnosis not present

## 2022-06-08 DIAGNOSIS — F840 Autistic disorder: Secondary | ICD-10-CM | POA: Diagnosis not present

## 2022-06-14 DIAGNOSIS — F79 Unspecified intellectual disabilities: Secondary | ICD-10-CM | POA: Diagnosis not present

## 2022-06-14 DIAGNOSIS — R269 Unspecified abnormalities of gait and mobility: Secondary | ICD-10-CM | POA: Diagnosis not present

## 2022-06-14 DIAGNOSIS — R636 Underweight: Secondary | ICD-10-CM | POA: Diagnosis not present

## 2022-06-28 DIAGNOSIS — Z681 Body mass index (BMI) 19 or less, adult: Secondary | ICD-10-CM | POA: Diagnosis not present

## 2022-06-28 DIAGNOSIS — M21611 Bunion of right foot: Secondary | ICD-10-CM | POA: Diagnosis not present

## 2022-06-28 DIAGNOSIS — R269 Unspecified abnormalities of gait and mobility: Secondary | ICD-10-CM | POA: Diagnosis not present

## 2022-06-28 DIAGNOSIS — Z79899 Other long term (current) drug therapy: Secondary | ICD-10-CM | POA: Diagnosis not present

## 2022-06-28 DIAGNOSIS — F39 Unspecified mood [affective] disorder: Secondary | ICD-10-CM | POA: Diagnosis not present

## 2022-06-28 DIAGNOSIS — F840 Autistic disorder: Secondary | ICD-10-CM | POA: Diagnosis not present

## 2022-06-28 DIAGNOSIS — L309 Dermatitis, unspecified: Secondary | ICD-10-CM | POA: Diagnosis not present

## 2022-06-28 DIAGNOSIS — G2401 Drug induced subacute dyskinesia: Secondary | ICD-10-CM | POA: Diagnosis not present

## 2022-06-28 DIAGNOSIS — R1310 Dysphagia, unspecified: Secondary | ICD-10-CM | POA: Diagnosis not present

## 2022-06-28 DIAGNOSIS — E786 Lipoprotein deficiency: Secondary | ICD-10-CM | POA: Diagnosis not present

## 2022-06-28 DIAGNOSIS — J4530 Mild persistent asthma, uncomplicated: Secondary | ICD-10-CM | POA: Diagnosis not present

## 2022-06-28 DIAGNOSIS — M21612 Bunion of left foot: Secondary | ICD-10-CM | POA: Diagnosis not present

## 2022-06-30 DIAGNOSIS — R1319 Other dysphagia: Secondary | ICD-10-CM | POA: Diagnosis not present

## 2022-07-11 DIAGNOSIS — R636 Underweight: Secondary | ICD-10-CM | POA: Diagnosis not present

## 2022-07-11 DIAGNOSIS — R269 Unspecified abnormalities of gait and mobility: Secondary | ICD-10-CM | POA: Diagnosis not present

## 2022-07-11 DIAGNOSIS — F79 Unspecified intellectual disabilities: Secondary | ICD-10-CM | POA: Diagnosis not present

## 2022-07-21 DIAGNOSIS — R1310 Dysphagia, unspecified: Secondary | ICD-10-CM | POA: Diagnosis not present

## 2022-07-21 DIAGNOSIS — K2289 Other specified disease of esophagus: Secondary | ICD-10-CM | POA: Diagnosis not present

## 2022-08-03 DIAGNOSIS — F840 Autistic disorder: Secondary | ICD-10-CM | POA: Diagnosis not present

## 2022-08-04 DIAGNOSIS — M21622 Bunionette of left foot: Secondary | ICD-10-CM | POA: Diagnosis not present

## 2022-08-04 DIAGNOSIS — M2012 Hallux valgus (acquired), left foot: Secondary | ICD-10-CM | POA: Diagnosis not present

## 2022-08-04 DIAGNOSIS — M21621 Bunionette of right foot: Secondary | ICD-10-CM | POA: Diagnosis not present

## 2022-08-04 DIAGNOSIS — M2011 Hallux valgus (acquired), right foot: Secondary | ICD-10-CM | POA: Diagnosis not present

## 2022-08-15 DIAGNOSIS — F79 Unspecified intellectual disabilities: Secondary | ICD-10-CM | POA: Diagnosis not present

## 2022-08-15 DIAGNOSIS — R636 Underweight: Secondary | ICD-10-CM | POA: Diagnosis not present

## 2022-08-15 DIAGNOSIS — R269 Unspecified abnormalities of gait and mobility: Secondary | ICD-10-CM | POA: Diagnosis not present

## 2022-08-25 DIAGNOSIS — R1310 Dysphagia, unspecified: Secondary | ICD-10-CM | POA: Diagnosis not present

## 2022-09-12 DIAGNOSIS — R636 Underweight: Secondary | ICD-10-CM | POA: Diagnosis not present

## 2022-09-12 DIAGNOSIS — F79 Unspecified intellectual disabilities: Secondary | ICD-10-CM | POA: Diagnosis not present

## 2022-09-12 DIAGNOSIS — R269 Unspecified abnormalities of gait and mobility: Secondary | ICD-10-CM | POA: Diagnosis not present

## 2022-09-19 DIAGNOSIS — R1310 Dysphagia, unspecified: Secondary | ICD-10-CM | POA: Diagnosis not present

## 2022-09-19 DIAGNOSIS — Z1211 Encounter for screening for malignant neoplasm of colon: Secondary | ICD-10-CM | POA: Diagnosis not present

## 2022-09-21 DIAGNOSIS — F840 Autistic disorder: Secondary | ICD-10-CM | POA: Diagnosis not present

## 2022-09-22 DIAGNOSIS — Z79899 Other long term (current) drug therapy: Secondary | ICD-10-CM | POA: Diagnosis not present

## 2022-10-05 DIAGNOSIS — K200 Eosinophilic esophagitis: Secondary | ICD-10-CM | POA: Diagnosis not present

## 2022-10-11 DIAGNOSIS — R269 Unspecified abnormalities of gait and mobility: Secondary | ICD-10-CM | POA: Diagnosis not present

## 2022-10-11 DIAGNOSIS — R636 Underweight: Secondary | ICD-10-CM | POA: Diagnosis not present

## 2022-10-11 DIAGNOSIS — F79 Unspecified intellectual disabilities: Secondary | ICD-10-CM | POA: Diagnosis not present

## 2022-10-20 DIAGNOSIS — K5909 Other constipation: Secondary | ICD-10-CM | POA: Diagnosis not present

## 2022-10-20 DIAGNOSIS — R1310 Dysphagia, unspecified: Secondary | ICD-10-CM | POA: Diagnosis not present

## 2022-11-14 DIAGNOSIS — R636 Underweight: Secondary | ICD-10-CM | POA: Diagnosis not present

## 2022-11-14 DIAGNOSIS — F79 Unspecified intellectual disabilities: Secondary | ICD-10-CM | POA: Diagnosis not present

## 2022-11-14 DIAGNOSIS — R269 Unspecified abnormalities of gait and mobility: Secondary | ICD-10-CM | POA: Diagnosis not present

## 2022-11-28 DIAGNOSIS — F840 Autistic disorder: Secondary | ICD-10-CM | POA: Diagnosis not present

## 2022-12-15 DIAGNOSIS — F840 Autistic disorder: Secondary | ICD-10-CM | POA: Diagnosis not present

## 2022-12-20 DIAGNOSIS — R636 Underweight: Secondary | ICD-10-CM | POA: Diagnosis not present

## 2022-12-20 DIAGNOSIS — R269 Unspecified abnormalities of gait and mobility: Secondary | ICD-10-CM | POA: Diagnosis not present

## 2022-12-20 DIAGNOSIS — F79 Unspecified intellectual disabilities: Secondary | ICD-10-CM | POA: Diagnosis not present

## 2022-12-29 DIAGNOSIS — F840 Autistic disorder: Secondary | ICD-10-CM | POA: Diagnosis not present

## 2023-01-24 DIAGNOSIS — F840 Autistic disorder: Secondary | ICD-10-CM | POA: Diagnosis not present

## 2023-02-02 DIAGNOSIS — R636 Underweight: Secondary | ICD-10-CM | POA: Diagnosis not present

## 2023-02-23 DIAGNOSIS — R269 Unspecified abnormalities of gait and mobility: Secondary | ICD-10-CM | POA: Diagnosis not present

## 2023-02-23 DIAGNOSIS — Z681 Body mass index (BMI) 19 or less, adult: Secondary | ICD-10-CM | POA: Diagnosis not present

## 2023-02-23 DIAGNOSIS — J4530 Mild persistent asthma, uncomplicated: Secondary | ICD-10-CM | POA: Diagnosis not present

## 2023-02-23 DIAGNOSIS — E786 Lipoprotein deficiency: Secondary | ICD-10-CM | POA: Diagnosis not present

## 2023-02-23 DIAGNOSIS — F39 Unspecified mood [affective] disorder: Secondary | ICD-10-CM | POA: Diagnosis not present

## 2023-02-23 DIAGNOSIS — F3481 Disruptive mood dysregulation disorder: Secondary | ICD-10-CM | POA: Diagnosis not present

## 2023-02-23 DIAGNOSIS — R1310 Dysphagia, unspecified: Secondary | ICD-10-CM | POA: Diagnosis not present

## 2023-02-23 DIAGNOSIS — F840 Autistic disorder: Secondary | ICD-10-CM | POA: Diagnosis not present

## 2023-02-23 DIAGNOSIS — Z0000 Encounter for general adult medical examination without abnormal findings: Secondary | ICD-10-CM | POA: Diagnosis not present

## 2023-03-01 DIAGNOSIS — R636 Underweight: Secondary | ICD-10-CM | POA: Diagnosis not present

## 2023-03-21 DIAGNOSIS — F3481 Disruptive mood dysregulation disorder: Secondary | ICD-10-CM | POA: Diagnosis not present

## 2023-03-22 DIAGNOSIS — Z79899 Other long term (current) drug therapy: Secondary | ICD-10-CM | POA: Diagnosis not present

## 2023-03-28 DIAGNOSIS — R636 Underweight: Secondary | ICD-10-CM | POA: Diagnosis not present

## 2023-03-30 DIAGNOSIS — R636 Underweight: Secondary | ICD-10-CM | POA: Diagnosis not present

## 2023-05-19 DIAGNOSIS — R636 Underweight: Secondary | ICD-10-CM | POA: Diagnosis not present

## 2023-05-30 DIAGNOSIS — R1310 Dysphagia, unspecified: Secondary | ICD-10-CM | POA: Diagnosis not present

## 2023-05-30 DIAGNOSIS — Z681 Body mass index (BMI) 19 or less, adult: Secondary | ICD-10-CM | POA: Diagnosis not present

## 2023-05-30 DIAGNOSIS — F840 Autistic disorder: Secondary | ICD-10-CM | POA: Diagnosis not present

## 2023-05-30 DIAGNOSIS — F39 Unspecified mood [affective] disorder: Secondary | ICD-10-CM | POA: Diagnosis not present

## 2023-05-30 DIAGNOSIS — J4530 Mild persistent asthma, uncomplicated: Secondary | ICD-10-CM | POA: Diagnosis not present

## 2023-05-30 DIAGNOSIS — R269 Unspecified abnormalities of gait and mobility: Secondary | ICD-10-CM | POA: Diagnosis not present

## 2023-05-30 DIAGNOSIS — Z79899 Other long term (current) drug therapy: Secondary | ICD-10-CM | POA: Diagnosis not present

## 2023-05-30 DIAGNOSIS — E786 Lipoprotein deficiency: Secondary | ICD-10-CM | POA: Diagnosis not present

## 2023-06-01 DIAGNOSIS — F70 Mild intellectual disabilities: Secondary | ICD-10-CM | POA: Diagnosis not present

## 2023-06-01 DIAGNOSIS — F840 Autistic disorder: Secondary | ICD-10-CM | POA: Diagnosis not present

## 2023-06-05 DIAGNOSIS — H5203 Hypermetropia, bilateral: Secondary | ICD-10-CM | POA: Diagnosis not present

## 2023-06-05 DIAGNOSIS — H52223 Regular astigmatism, bilateral: Secondary | ICD-10-CM | POA: Diagnosis not present

## 2023-06-12 DIAGNOSIS — J029 Acute pharyngitis, unspecified: Secondary | ICD-10-CM | POA: Diagnosis not present

## 2023-06-19 DIAGNOSIS — R636 Underweight: Secondary | ICD-10-CM | POA: Diagnosis not present

## 2023-06-20 DIAGNOSIS — F3481 Disruptive mood dysregulation disorder: Secondary | ICD-10-CM | POA: Diagnosis not present

## 2023-06-26 DIAGNOSIS — F70 Mild intellectual disabilities: Secondary | ICD-10-CM | POA: Diagnosis not present

## 2023-06-26 DIAGNOSIS — F840 Autistic disorder: Secondary | ICD-10-CM | POA: Diagnosis not present

## 2023-07-12 DIAGNOSIS — F840 Autistic disorder: Secondary | ICD-10-CM | POA: Diagnosis not present

## 2023-07-12 DIAGNOSIS — F70 Mild intellectual disabilities: Secondary | ICD-10-CM | POA: Diagnosis not present

## 2023-07-21 DIAGNOSIS — R636 Underweight: Secondary | ICD-10-CM | POA: Diagnosis not present

## 2023-08-18 DIAGNOSIS — R636 Underweight: Secondary | ICD-10-CM | POA: Diagnosis not present

## 2023-09-12 DIAGNOSIS — F3481 Disruptive mood dysregulation disorder: Secondary | ICD-10-CM | POA: Diagnosis not present

## 2023-09-14 DIAGNOSIS — Z79899 Other long term (current) drug therapy: Secondary | ICD-10-CM | POA: Diagnosis not present

## 2023-09-17 DIAGNOSIS — R636 Underweight: Secondary | ICD-10-CM | POA: Diagnosis not present

## 2023-11-08 DIAGNOSIS — R636 Underweight: Secondary | ICD-10-CM | POA: Diagnosis not present

## 2023-11-09 DIAGNOSIS — F3481 Disruptive mood dysregulation disorder: Secondary | ICD-10-CM | POA: Diagnosis not present

## 2023-11-13 ENCOUNTER — Other Ambulatory Visit: Payer: Self-pay

## 2023-11-13 MED ORDER — ENSURE ACTIVE HIGH PROTEIN PO LIQD: 330.0000 mL | Freq: Four times a day (QID) | ORAL | 5 refills | Status: AC

## 2023-12-12 DIAGNOSIS — R636 Underweight: Secondary | ICD-10-CM | POA: Diagnosis not present

## 2023-12-19 DIAGNOSIS — F3481 Disruptive mood dysregulation disorder: Secondary | ICD-10-CM | POA: Diagnosis not present

## 2024-01-16 DIAGNOSIS — R636 Underweight: Secondary | ICD-10-CM | POA: Diagnosis not present

## 2024-01-30 ENCOUNTER — Ambulatory Visit (INDEPENDENT_AMBULATORY_CARE_PROVIDER_SITE_OTHER): Admitting: Family

## 2024-01-30 ENCOUNTER — Encounter: Payer: Self-pay | Admitting: Family

## 2024-01-30 ENCOUNTER — Telehealth: Payer: Self-pay | Admitting: *Deleted

## 2024-01-30 VITALS — BP 102/76 | HR 73 | Temp 98.5°F | Resp 16 | Ht 68.0 in | Wt 125.0 lb

## 2024-01-30 DIAGNOSIS — Z79899 Other long term (current) drug therapy: Secondary | ICD-10-CM | POA: Diagnosis not present

## 2024-01-30 DIAGNOSIS — L709 Acne, unspecified: Secondary | ICD-10-CM | POA: Diagnosis not present

## 2024-01-30 DIAGNOSIS — J4540 Moderate persistent asthma, uncomplicated: Secondary | ICD-10-CM | POA: Diagnosis not present

## 2024-01-30 DIAGNOSIS — F840 Autistic disorder: Secondary | ICD-10-CM | POA: Diagnosis not present

## 2024-01-30 DIAGNOSIS — Z0000 Encounter for general adult medical examination without abnormal findings: Secondary | ICD-10-CM | POA: Diagnosis not present

## 2024-01-30 DIAGNOSIS — D696 Thrombocytopenia, unspecified: Secondary | ICD-10-CM | POA: Diagnosis not present

## 2024-01-30 DIAGNOSIS — Z111 Encounter for screening for respiratory tuberculosis: Secondary | ICD-10-CM

## 2024-01-30 DIAGNOSIS — F71 Moderate intellectual disabilities: Secondary | ICD-10-CM

## 2024-01-30 DIAGNOSIS — F3481 Disruptive mood dysregulation disorder: Secondary | ICD-10-CM | POA: Diagnosis not present

## 2024-01-30 DIAGNOSIS — R350 Frequency of micturition: Secondary | ICD-10-CM | POA: Diagnosis not present

## 2024-01-30 LAB — CBC WITH DIFFERENTIAL/PLATELET
Basophils Absolute: 0 K/uL (ref 0.0–0.1)
Basophils Relative: 0.3 % (ref 0.0–3.0)
Eosinophils Absolute: 0.2 K/uL (ref 0.0–0.7)
Eosinophils Relative: 3.1 % (ref 0.0–5.0)
HCT: 43.1 % (ref 39.0–52.0)
Hemoglobin: 14.8 g/dL (ref 13.0–17.0)
Lymphocytes Relative: 28.3 % (ref 12.0–46.0)
Lymphs Abs: 1.5 K/uL (ref 0.7–4.0)
MCHC: 34.5 g/dL (ref 30.0–36.0)
MCV: 90.4 fl (ref 78.0–100.0)
Monocytes Absolute: 0.6 K/uL (ref 0.1–1.0)
Monocytes Relative: 10.2 % (ref 3.0–12.0)
Neutro Abs: 3.2 K/uL (ref 1.4–7.7)
Neutrophils Relative %: 58.1 % (ref 43.0–77.0)
Platelets: 200 K/uL (ref 150.0–400.0)
RBC: 4.76 Mil/uL (ref 4.22–5.81)
RDW: 13.2 % (ref 11.5–15.5)
WBC: 5.5 K/uL (ref 4.0–10.5)

## 2024-01-30 LAB — COMPREHENSIVE METABOLIC PANEL WITH GFR
ALT: 19 U/L (ref 3–53)
AST: 13 U/L (ref 5–37)
Albumin: 4.3 g/dL (ref 3.5–5.2)
Alkaline Phosphatase: 28 U/L — ABNORMAL LOW (ref 39–117)
BUN: 12 mg/dL (ref 6–23)
CO2: 35 meq/L — ABNORMAL HIGH (ref 19–32)
Calcium: 9.6 mg/dL (ref 8.4–10.5)
Chloride: 98 meq/L (ref 96–112)
Creatinine, Ser: 0.6 mg/dL (ref 0.40–1.50)
GFR: 134.63 mL/min
Glucose, Bld: 72 mg/dL (ref 70–99)
Potassium: 4.4 meq/L (ref 3.5–5.1)
Sodium: 138 meq/L (ref 135–145)
Total Bilirubin: 0.4 mg/dL (ref 0.2–1.2)
Total Protein: 7.6 g/dL (ref 6.0–8.3)

## 2024-01-30 MED ORDER — CLINDAMYCIN PHOS (ONCE-DAILY) 1 % EX GEL: 1.0000 | Freq: Two times a day (BID) | CUTANEOUS | 2 refills | Status: AC

## 2024-01-30 NOTE — Assessment & Plan Note (Signed)
 Patient is at baseline.

## 2024-01-30 NOTE — Assessment & Plan Note (Signed)
 He is followed by Psychiatry, Dr. Granville.  Managed with Depakote , risperidone , and Buspar. Per mother, behavioral and mood improvements noted with medication adjustments which has included increased verbal communication observed.

## 2024-01-30 NOTE — Assessment & Plan Note (Signed)
" °  Immunizations are up to date. Recent flu shot received. Dental care is current with an upcoming appointment in February. Vision is stable with glasses worn consistently. - Ordered basic labs to check blood sugar, blood count, and cholesterol. "

## 2024-01-30 NOTE — Assessment & Plan Note (Signed)
 Uncontrolled. Trial of clindamycin  gel.

## 2024-01-30 NOTE — Progress Notes (Signed)
 "  Subjective:     Patient ID: [name], male    DOB: 03-13-2000, 23 y.o.   MRN: [id number]  Chief Complaint  Patient presents with   New Patient (Initial Visit)    HPI  Discussed the use of AI scribe software for clinical note transcription with the patient, who gave verbal consent to proceed.  History of Present Illness [name] is a 23 year old male with autism who presents for an annual physical exam. He is accompanied by his adoptive parents who provide the history.   He has a history of autism. He attends a day program and has two younger adopted siblings. He follows with a psychiatrist who prescribes Depakote , risperidone , and Buspar. His parents report improvement with reduced medication, and he is attempting to talk more, repeating words and phrases.  He has a history of asthma, managed with daily nebulizer treatments. His asthma symptoms are well-controlled, with exacerbations occurring about once a year. Recently, he has been experiencing a dry cough and a slightly stuffy nose, but no significant sore throat or other symptoms.  He has acne on his face, which sometimes becomes very red, and his parents are concerned about this.  He urinates frequently, sometimes producing only a small amount of urine, but experiences no discomfort during urination. His parents note that he drinks water regularly but not excessively.  He wears glasses and has been using the same pair for about three years. His parents have noticed that he puts on his glasses immediately upon waking, which is a change from his previous behavior.  His diet is described as balanced, and he eats everything given to him. He moves his bowels regularly and has no joint pain or headaches. He is up to date with dental appointments and has an upcoming appointment in February.      Health Maintenance Due  Topic Date Due   Pneumococcal Vaccine (1 of 1 - PPSV23, PCV20, or PCV21)  01/24/2005   Hepatitis C Screening  Never done   COVID-19 Vaccine (4 - 2025-26 season) 10/09/2023   Medicare Annual Wellness (AWV)  02/23/2024    Past Medical History:  Diagnosis Date   Asthma    Autism    Constipation    Gastroschisis    Mental retardation    Small bowel obstruction (HCC) 05/27/2016    Past Surgical History:  Procedure Laterality Date   GASTROSCHISIS CLOSURE  08/01/98   GASTROSTOMY     NISSEN FUNDOPLICATION     ORCHIOPEXY Left     Family History  Adopted: Yes  Problem Relation Age of Onset   Lupus Mother     Social History   Socioeconomic History   Marital status: Single    Spouse name: Not on file   Number of children: Not on file   Years of education: Not on file   Highest education level: Not on file  Occupational History   Not on file  Tobacco Use   Smoking status: Never   Smokeless tobacco: Never  Vaping Use   Vaping status: Never Used  Substance and Sexual Activity   Alcohol  use: No    Alcohol /week: 0.0 standard drinks of alcohol    Drug use: No   Sexual activity: Never  Other Topics Concern   Not on file  Social History Narrative   ** Merged History Encounter **       [name] is a rising 12th grade student. He attends [employer]. He  lives with his parents. He has two siblings.    Social Drivers of Health   Tobacco Use: Low Risk (01/30/2024)   Patient History    Smoking Tobacco Use: Never    Smokeless Tobacco Use: Never    Passive Exposure: Not on file  Financial Resource Strain: Not on file  Food Insecurity: Not on file  Transportation Needs: Not on file  Physical Activity: Not on file  Stress: Not on file  Social Connections: Not on file  Intimate Partner Violence: Not on file  Depression (EYV7-0): Not on file  Alcohol  Screen: Not on file  Housing: Not on file  Utilities: Not on file  Health Literacy: Not on file    Outpatient Medications Prior to Visit  Medication Sig Dispense Refill   albuterol   (PROVENTIL ) (2.5 MG/3ML) 0.083% nebulizer solution Take 3 mLs (2.5 mg total) by nebulization every 4 (four) hours as needed for wheezing or shortness of breath. 75 mL 1   busPIRone (BUSPAR) 15 MG tablet Take 15 mg by mouth 2 (two) times daily.     cetirizine  (ZYRTEC ) 10 MG tablet TAKE 1 TABLET BY MOUTH DAILY FOR NASAL CONGESTION 30 tablet 11   divalproex  (DEPAKOTE ) 250 MG DR tablet TAKE 1 TABLET BY MOUTH EVERY MORNING AND THEN 2 TABLETS EVERY NIGHT AT BEDTIME 90 tablet 2   ENSURE (ENSURE) Take 1 Can by mouth 2 (two) times daily between meals. 237 mL 12   Nutritional Supplements (ENSURE ACTIVE HIGH PROTEIN) LIQD Take 330 mLs by mouth 4 (four) times daily. 39600 mL 5   polyethylene glycol powder (GLYCOLAX /MIRALAX ) powder Take 17 g by mouth daily. 500 g 11   risperiDONE  (RISPERDAL ) 0.5 MG tablet Take 1 tab po qam, 1/2 tab at lunchtime and 1 tab qhs 93 tablet 2   budesonide  (PULMICORT ) 0.5 MG/2ML nebulizer solution Take 2 mLs (0.5 mg total) by nebulization in the morning and at bedtime. 60 mL 12   HYDROXYZINE PAMOATE PO Take 25 mg by mouth.     Melatonin 3 MG CAPS Take by mouth.     No facility-administered medications prior to visit.    Allergies[1]  Review of Systems  HENT:  Negative for congestion and hearing loss.   Eyes:  Negative for blurred vision.  Respiratory:  Negative for cough.   Cardiovascular:  Negative for leg swelling.  Gastrointestinal:  Negative for constipation and diarrhea.  Genitourinary:  Positive for frequency. Negative for dysuria.  Musculoskeletal:  Negative for joint pain and myalgias.  Skin:  Negative for rash.  Neurological:  Negative for headaches.  Psychiatric/Behavioral:  Negative for depression. The patient is not nervous/anxious.        Objective:    Physical Exam Constitutional:      General: He is not in acute distress. HENT:     Head: Normocephalic and atraumatic.  Cardiovascular:     Rate and Rhythm: Normal rate and regular rhythm.  Pulmonary:      Effort: Pulmonary effort is normal. No respiratory distress.     Breath sounds: No wheezing.  Abdominal:     Palpations: Abdomen is soft.  Skin:    General: Skin is warm and dry.     Comments: Some facial acne noted  Neurological:     Mental Status: He is alert. Mental status is at baseline.     Comments: Minimally verbal Developmentally delayed      BP 102/76 (BP Location: Left Arm, Patient Position: Sitting, Cuff Size: Normal)   Pulse 73  Temp 98.5 F (36.9 C) (Oral)   Resp 16   Ht 5' 8 (1.727 m)   Wt 125 lb (56.7 kg)   SpO2 100%   BMI 19.01 kg/m  Wt Readings from Last 3 Encounters:  01/30/24 125 lb (56.7 kg)  12/25/18 117 lb (53.1 kg) (2%, Z= -2.02)*  11/10/17 112 lb 12.8 oz (51.2 kg) (2%, Z= -2.16)*   * Growth percentiles are based on CDC (Boys, 2-20 Years) data.       Assessment & Plan:   Problem List Items Addressed This Visit       Unprioritized   Urinary frequency    Increased urinary frequency with variable urine output. No dysuria reported. - Ordered urinalysis to check for infection. - Provided urine collection cup if unable to collect sample during visit.       Relevant Orders   Urinalysis, Routine w reflex microscopic   Urine Culture   Preventative health care - Primary    Immunizations are up to date. Recent flu shot received. Dental care is current with an upcoming appointment in February. Vision is stable with glasses worn consistently. - Ordered basic labs to check blood sugar, blood count, and cholesterol.      On valproic  acid therapy   Relevant Orders   Comp Met (CMET)   Moderate persistent asthma without complication    Well-controlled with daily nebulizer use. Symptoms occur infrequently, approximately once a year.      Moderate intellectual disability   Patient is at baseline.       Disruptive mood dysregulation disorder   He is followed by Psychiatry, Dr. Granville.  Managed with Depakote , risperidone , and Buspar. Per  mother, behavioral and mood improvements noted with medication adjustments which has included increased verbal communication observed.      Autism spectrum disorder with accompanying language impairment and intellectual disability, requiring substantial support   He attends a day program and has a lot of support at home.      Acne   Uncontrolled. Trial of clindamycin  gel.       Relevant Medications   Clindamycin  Phos, Once-Daily, (CLINDAGEL) 1 % GEL   Other Visit Diagnoses       Thrombocytopenia       Relevant Orders   CBC w/Diff     Screening for tuberculosis       Relevant Orders   QuantiFERON-TB Gold Plus      Assessment & Plan    I have discontinued Sebastiano O. Cabeiro Henson's Melatonin and HYDROXYZINE PAMOATE PO. I am also having him start on Clindamycin  Phos (Once-Daily). Additionally, I am having him maintain his Ensure, risperiDONE , divalproex , polyethylene glycol powder, albuterol , cetirizine , budesonide , Ensure Active High Protein, and busPIRone.  Meds ordered this encounter  Medications   Clindamycin  Phos, Once-Daily, (CLINDAGEL) 1 % GEL    Sig: Apply 1 Application topically 2 (two) times daily.    Dispense:  75 mL    Refill:  2    Supervising Provider:   DOMENICA BLACKBIRD A [[id number]]       [1] No Known Allergies  "

## 2024-01-30 NOTE — Patient Instructions (Signed)
" °  VISIT SUMMARY: Today, you came in for your annual physical exam. We discussed your overall health, including your asthma, autism, acne, and urinary frequency. Your parents mentioned that you have been doing well with your current medications and have shown improvements in your behavior and communication. We also reviewed your general health maintenance, including your immunizations, dental care, and vision.  YOUR PLAN: -ACNE: Acne is a skin condition that occurs when hair follicles become clogged with oil and dead skin cells, leading to pimples and redness. You have been prescribed an antibiotic gel to apply twice daily and advised to wash your face with a gentle facial soap like Cetaphil.  -URINARY FREQUENCY: Urinary frequency means needing to urinate more often than usual. We have ordered a urinalysis to check for any possible infection. You were given a urine collection cup in case you couldn't provide a sample during the visit.  -ASTHMA: Asthma is a condition where your airways narrow and swell, making it difficult to breathe. Your asthma is well-controlled with daily nebulizer treatments, and you experience symptoms infrequently, about once a year.  -AUTISM SPECTRUM DISORDER: Autism spectrum disorder is a developmental disorder that affects communication and behavior. You are managing well with your current medications, and your parents have noticed improvements in your behavior and communication.  -GENERAL HEALTH MAINTENANCE: Your immunizations are up to date, and you recently received a flu shot. Your dental care is current with an upcoming appointment in February. Your vision is stable, and you consistently wear your glasses. We have ordered basic labs to check your blood sugar, blood count, and cholesterol.  INSTRUCTIONS: Please follow up with the urinalysis results once they are available. Continue using the antibiotic gel for your acne as prescribed and follow the advice for gentle facial  cleansing. Keep up with your daily nebulizer treatments for asthma. Maintain your current medications for autism and monitor any changes in behavior or communication. Attend your upcoming dental appointment in February and continue wearing your glasses as needed.                   "

## 2024-01-30 NOTE — Telephone Encounter (Signed)
 Check for paperwork completion and if TB gold is back.

## 2024-01-30 NOTE — Assessment & Plan Note (Signed)
 He attends a day program and has a lot of support at home.

## 2024-01-30 NOTE — Assessment & Plan Note (Signed)
" °  Well-controlled with daily nebulizer use. Symptoms occur infrequently, approximately once a year. "

## 2024-01-30 NOTE — Assessment & Plan Note (Signed)
" °  Increased urinary frequency with variable urine output. No dysuria reported. - Ordered urinalysis to check for infection. - Provided urine collection cup if unable to collect sample during visit.  "

## 2024-01-31 ENCOUNTER — Encounter: Payer: Self-pay | Admitting: Family

## 2024-01-31 LAB — URINE CULTURE
MICRO NUMBER:: 17391838
Result:: NO GROWTH
SPECIMEN QUALITY:: ADEQUATE

## 2024-01-31 LAB — URINALYSIS, ROUTINE W REFLEX MICROSCOPIC
Bilirubin Urine: NEGATIVE
Hgb urine dipstick: NEGATIVE
Ketones, ur: NEGATIVE
Leukocytes,Ua: NEGATIVE
Nitrite: NEGATIVE
RBC / HPF: NONE SEEN
Specific Gravity, Urine: 1.02 (ref 1.000–1.030)
Total Protein, Urine: NEGATIVE
Urine Glucose: NEGATIVE
Urobilinogen, UA: 0.2 (ref 0.0–1.0)
WBC, UA: NONE SEEN
pH: 6.5 (ref 5.0–8.0)

## 2024-02-02 ENCOUNTER — Ambulatory Visit: Payer: Self-pay | Admitting: Family

## 2024-02-02 LAB — QUANTIFERON-TB GOLD PLUS
Mitogen-NIL: 7.65 [IU]/mL
NIL: 0.01 [IU]/mL
QuantiFERON-TB Gold Plus: NEGATIVE
TB1-NIL: 0 [IU]/mL
TB2-NIL: 0 [IU]/mL

## 2024-02-05 NOTE — Telephone Encounter (Signed)
 TB gold results ready, patient's guardian notified she can come get forms with results.

## 2024-02-27 ENCOUNTER — Telehealth: Payer: Self-pay

## 2024-02-27 NOTE — Telephone Encounter (Signed)
 See hand written Rx please.

## 2024-02-27 NOTE — Telephone Encounter (Signed)
 Patient's mother notified.

## 2024-02-27 NOTE — Telephone Encounter (Signed)
 Rx sent to [facility] today

## 2024-02-27 NOTE — Telephone Encounter (Unsigned)
 Copied from CRM [id number]. Topic: Clinical - Medication Question >> Feb 27, 2024 10:38 AM Alfonso HERO wrote: Reason for CRM: Patient was getting his Ensure from [facility] and they will not give him anymore stating she now has to pay for it when she never had to in the past. She was told by the insurance to have the doctor send over an Rx. She says she gave the doctor the info on where to send the Rx and it needs to explain the patient needs it.   Phone [phone] Fax [phone]
